# Patient Record
Sex: Male | Born: 1980 | Race: White | Hispanic: No | Marital: Married | Smoking: Current every day smoker
Health system: Southern US, Community
[De-identification: ages and names within clinical notes are randomized; demographics above are authoritative.]

## PROBLEM LIST (undated history)

## (undated) DIAGNOSIS — N289 Disorder of kidney and ureter, unspecified: Secondary | ICD-10-CM

## (undated) DIAGNOSIS — J45909 Unspecified asthma, uncomplicated: Secondary | ICD-10-CM

---

## 2009-06-19 ENCOUNTER — Emergency Department: Payer: Self-pay | Admitting: Emergency Medicine

## 2011-03-23 NOTE — Assessment & Plan Note (Signed)
[facility]                                 ON-CALL NOTE   LEVELLE, EDELEN                         MRN:          [id number]  DATE:06/19/2009                            DOB:          1980-12-15    Hall record #[id number].   Telephone number cell [phone].   I received a phone call from [facility] emergency room on June 19, 2009  at 7:05 a.m.  They asked me to arrange outpatient follow-up for Mr.  Neilson Oehlert. The patient is 30 years of age.  He does smoke.  There is  a strong family history of coronary disease.  The patient presented to  the [facility] emergency room with chest and arm discomfort.  This was  felt to be very atypical.  Cardiac enzymes were negative.  EKG revealed  slight T-wave in lead III.  It was felt overall that the patient's  presentation was not consistent with an acute coronary syndrome.  It was  felt he could go home with outpatient cardiac follow-up.   The patient is discharged from [facility] emergency room this morning.  I  will be contacting our office in [place] this morning to arrange for  the patient to be contacted for outpatient follow-up.     Luis Abed, MD, [employer]  Electronically Signed    JDK/MedQ  DD: 06/19/2009  DT: 06/19/2009  Job #: [id number]

## 2013-09-12 ENCOUNTER — Emergency Department: Payer: Self-pay | Admitting: Emergency Medicine

## 2013-09-13 LAB — CBC
HCT: 46 % (ref 40.0–52.0)
MCHC: 34.9 g/dL (ref 32.0–36.0)
MCV: 92 fL (ref 80–100)
Platelet: 218 10*3/uL (ref 150–440)
RBC: 4.97 10*6/uL (ref 4.40–5.90)

## 2013-09-13 LAB — URINALYSIS, COMPLETE
Bilirubin,UR: NEGATIVE
Hyaline Cast: 2
Leukocyte Esterase: NEGATIVE
Nitrite: NEGATIVE
Protein: 100
RBC,UR: 370 /HPF (ref 0–5)
Specific Gravity: 1.025 (ref 1.003–1.030)
Squamous Epithelial: <1
WBC UR: 3 /HPF (ref 0–5)

## 2013-09-13 LAB — COMPREHENSIVE METABOLIC PANEL
Alkaline Phosphatase: 99 U/L (ref 50–136)
Anion Gap: 8 (ref 7–16)
Calcium, Total: 9.2 mg/dL (ref 8.5–10.1)
Creatinine: 1 mg/dL (ref 0.60–1.30)
EGFR (African American): >60
Glucose: 114 mg/dL — ABNORMAL HIGH (ref 65–99)
Potassium: 3.3 mmol/L — ABNORMAL LOW (ref 3.5–5.1)
SGOT(AST): 26 U/L (ref 15–37)
Total Protein: 7.9 g/dL (ref 6.4–8.2)

## 2014-07-27 IMAGING — CT CT STONE STUDY
3 of 5 series · 5 of 16 positions shown, 6 images · non-contrast
Comparison: No priors.

CLINICAL DATA: Right-sided flank pain.

EXAM:
CT ABDOMEN AND PELVIS WITHOUT
TECHNIQUE: Multidetector CT imaging of the abdomen and pelvis was performed
following the standard protocol without IV contrast.

[Series 4: lung · axial · 0.79mm/px · z∈[-554,-554]mm · 1 of 18 slices shown, 2 images]
[im 1/18  soft-tissue]
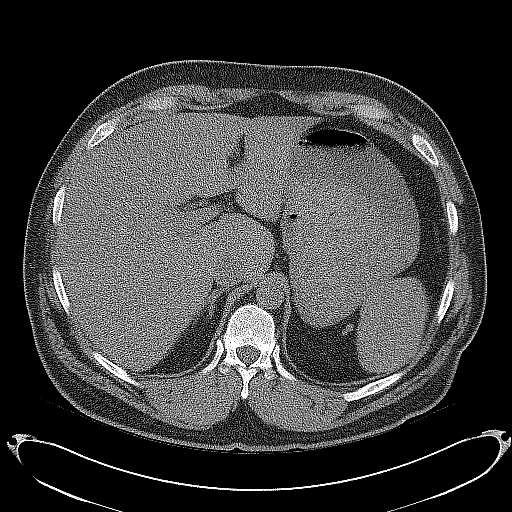
[im 1/18  bone]
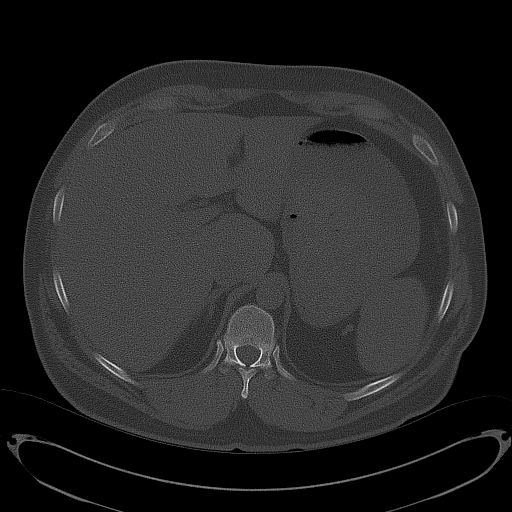

[Series 5: coronal · coronal · 0.80mm/px · 3 of 131 slices shown]
[im 33/131  soft-tissue]
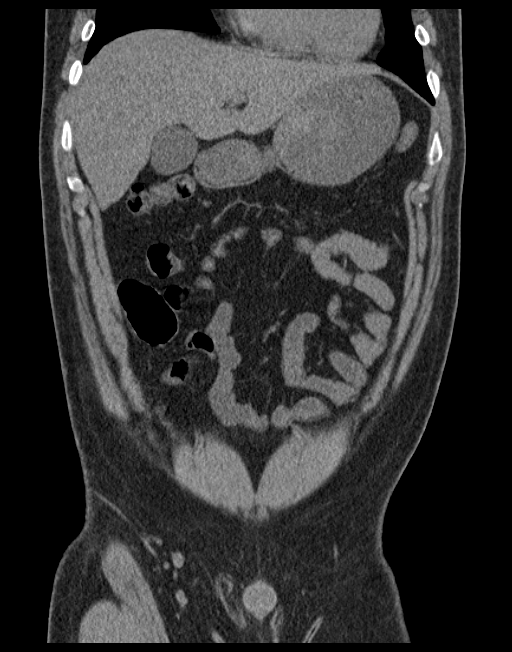
[im 66/131  soft-tissue]
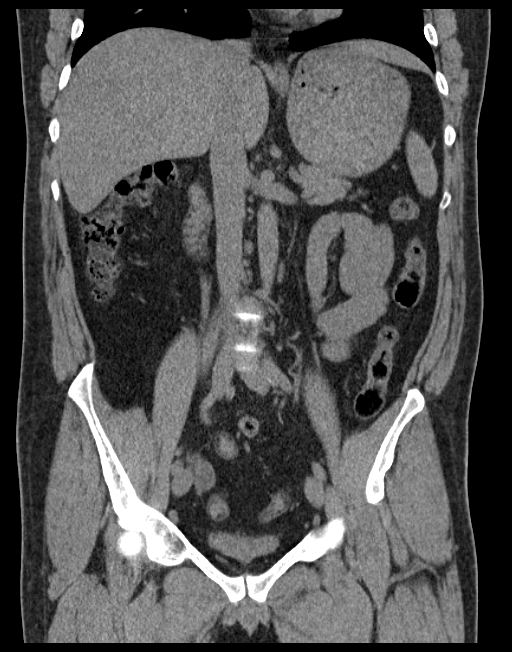
[im 98/131  soft-tissue]
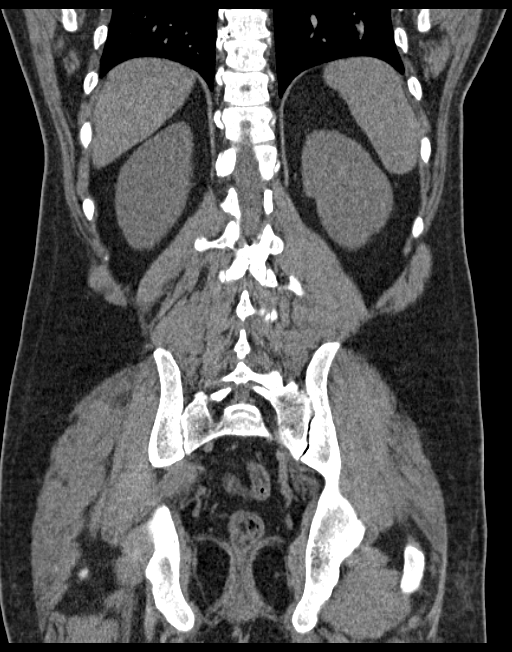

[Series 6: sagittal · sagittal · 0.67mm/px · 1 of 174 slices shown]
[im 70/174  soft-tissue]
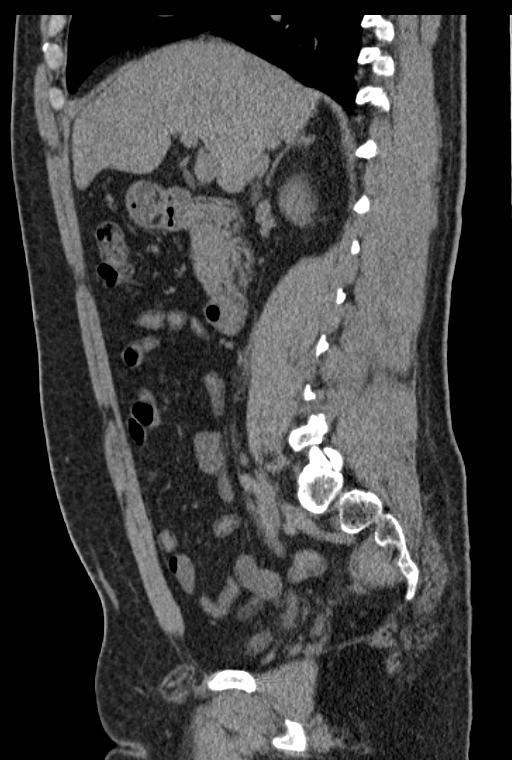

[5 of 16 positions shown; findings below may reference images not displayed]

FINDINGS: Lung Bases: Unremarkable.

Abdomen/Pelvis: Image 85 of series 2 demonstrates a 2 mm calculus at
or immediately beyond the right ureterovesicular junction. This is
associated with mild right-sided hydroureteronephrosis. 4 mm
nonobstructive calculus in the lower pole collecting system of the
left kidney is also noted. The unenhanced appearance of the kidneys
is otherwise unremarkable bilaterally.

The unenhanced appearance of the liver, gallbladder, pancreas,
spleen and bilateral adrenal glands is unremarkable. Normal
appendix. No significant volume of ascites. No pneumoperitoneum. No
pathologic distention of small bowel. No definite lymphadenopathy
identified within the abdomen or pelvis on today's non contrast CT
examination. Prostate gland and urinary bladder are otherwise
unremarkable in appearance.

Musculoskeletal: There are no aggressive appearing lytic or blastic
lesions noted in the visualized portions of the skeleton.
IMPRESSION: 1. 2 mm partially obstructive calculus at or immediately beyond the
right ureterovesicular junction with mild right
hydroureteronephrosis.
2. 4 mm nonobstructive calculus in the lower pole collecting system
of left kidney.
3. Normal appendix.

## 2015-02-28 ENCOUNTER — Emergency Department
Admit: 2015-02-28 | Disposition: A | Discharge status: Home / Self Care | Payer: Self-pay | Admitting: Emergency Medicine

## 2015-03-08 ENCOUNTER — Emergency Department
Admit: 2015-03-08 | Disposition: A | Discharge status: Home / Self Care | Payer: Self-pay | Admitting: Emergency Medicine

## 2015-08-13 ENCOUNTER — Encounter: Payer: Self-pay | Admitting: Emergency Medicine

## 2015-08-13 ENCOUNTER — Emergency Department
Admission: EM | Admit: 2015-08-13 | Discharge: 2015-08-13 | Disposition: A | Discharge status: Home / Self Care | Payer: Medicaid Other | Attending: Emergency Medicine | Admitting: Emergency Medicine

## 2015-08-13 DIAGNOSIS — R51 Headache: Secondary | ICD-10-CM | POA: Insufficient documentation

## 2015-08-13 DIAGNOSIS — R519 Headache, unspecified: Secondary | ICD-10-CM

## 2015-08-13 DIAGNOSIS — M542 Cervicalgia: Secondary | ICD-10-CM | POA: Insufficient documentation

## 2015-08-13 DIAGNOSIS — Z720 Tobacco use: Secondary | ICD-10-CM | POA: Insufficient documentation

## 2015-08-13 HISTORY — DX: Unspecified asthma, uncomplicated: J45.909

## 2015-08-13 HISTORY — DX: Disorder of kidney and ureter, unspecified: N28.9

## 2015-08-13 MED ORDER — DIPHENHYDRAMINE HCL 50 MG/ML IJ SOLN
25.0000 mg | Freq: Once | INTRAMUSCULAR | Status: AC
  Administered 2015-08-13: 25 mg via INTRAVENOUS
  Filled 2015-08-13: qty 1

## 2015-08-13 MED ORDER — PROCHLORPERAZINE EDISYLATE 5 MG/ML IJ SOLN
10.0000 mg | Freq: Once | INTRAMUSCULAR | Status: AC
  Administered 2015-08-13: 10 mg via INTRAVENOUS
  Filled 2015-08-13: qty 2

## 2015-08-13 MED ORDER — BUTALBITAL-APAP-CAFFEINE 50-325-40 MG PO TABS: 1.0000 | ORAL_TABLET | Freq: Four times a day (QID) | ORAL | Status: AC | PRN

## 2015-08-13 MED ORDER — SODIUM CHLORIDE 0.9 % IV BOLUS (SEPSIS)
1000.0000 mL | Freq: Once | INTRAVENOUS | Status: AC
  Administered 2015-08-13: 1000 mL via INTRAVENOUS

## 2015-08-13 NOTE — Discharge Instructions (Signed)

## 2015-08-13 NOTE — ED Provider Notes (Signed)
[facility] Emergency Department Provider Note  ____________________________________________  Time seen: Approximately 11:22 AM  I have reviewed the triage vital signs and the nursing notes.   HISTORY  Chief Complaint Headache    HPI Christopher Holland is a 34 y.o. male who denies any chronic medical problems who is presenting today with right sided neck as well as head pain over the past one half weeks. He says that it comes and goes and worsens when he is holding up his head. However, he said that this last episode started at 2 AM mildly and then increased to it is now which is an 8 out of 9. He says that the pain radiates up the right side of his head and hurts when he even touches his hair. He says that he also has blurred vision to his right eye when he is having this pain.   Past Medical History  Diagnosis Date  . Renal disorder   . Asthma     There are no active problems to display for this patient.   History reviewed. No pertinent past surgical history.  No current outpatient prescriptions on file.  Allergies Review of patient's allergies indicates no known allergies.  History reviewed. No pertinent family history.  Social History Social History  Substance Use Topics  . Smoking status: Current Every Day Smoker  . Smokeless tobacco: None  . Alcohol Use: Yes    Review of Systems Constitutional: No fever/chills Eyes: As above  ENT: No sore throat. Cardiovascular: Denies chest pain. Respiratory: Denies shortness of breath. Gastrointestinal: No abdominal pain.  No nausea, no vomiting.  No diarrhea.  No constipation. Genitourinary: Negative for dysuria. Musculoskeletal: Negative for back pain. Skin: Negative for rash. Neurological: Negative focal weakness or numbness.  10-point ROS otherwise negative.  ____________________________________________   PHYSICAL EXAM:  VITAL SIGNS: ED Triage Vitals  Enc Vitals Group     BP 08/13/15  1033 144/92 mmHg     Pulse Rate 08/13/15 1033 84     Resp 08/13/15 1033 18     Temp 08/13/15 1033 97.5 F (36.4 C)     Temp Source 08/13/15 1033 Oral     SpO2 08/13/15 1033 98 %     Weight 08/13/15 1033 230 lb (104.327 kg)     Height 08/13/15 1033 6' (1.829 m)     Head Cir --      Peak Flow --      Pain Score 08/13/15 1048 10     Pain Loc --      Pain Edu? --      Excl. in GC? --     Constitutional: Alert and oriented. Well appearing and in no acute distress. Eyes: Conjunctivae are normal. PERRL. EOMI. Head: Atraumatic. Nose: No congestion/rhinnorhea. Mouth/Throat: Mucous membranes are moist.  Oropharynx non-erythematous. Neck: No stridor.  Mild tenderness to the right trapezius. Ranges the neck freely without any distress or meningismus.  Cardiovascular: Normal rate, regular rhythm. Grossly normal heart sounds.  Good peripheral circulation. Respiratory: Normal respiratory effort.  No retractions. Lungs CTAB. Gastrointestinal: Soft and nontender. No distention. No abdominal bruits. No CVA tenderness. Musculoskeletal: No lower extremity tenderness nor edema.  No joint effusions. Neurologic:  Normal speech and language. No gross focal neurologic deficits are appreciated. No gait instability. Skin:  Skin is warm, dry and intact. No rash noted. Psychiatric: Mood and affect are normal. Speech and behavior are normal.  ____________________________________________   LABS (all labs ordered are listed, but only abnormal  results are displayed)  Labs Reviewed - No data to display ____________________________________________  EKG   ____________________________________________  RADIOLOGY   ____________________________________________   PROCEDURES    ____________________________________________   INITIAL IMPRESSION / ASSESSMENT AND PLAN / ED COURSE  Pertinent labs & imaging results that were available during my care of the patient were reviewed by me and considered in my  medical decision making (see chart for details).  ----------------------------------------- 1:56 PM on 08/13/2015 -----------------------------------------  Patient resting comfortably at this time with headache now in a 2-3 out of 10. Discussed with the patient the possibility of a lidocaine injection to the occiput and over the patient would not like this at this time. He says his pain is much relieved and is willing to follow-up with a neurologist as an outpatient. Symptoms are largely migrainous. No visual change at this time. We'll discharge patient with Fioricet for symptomatic control. Do not suspect a cranial hemorrhage. Symptoms are waxing and waning and not of a sudden onset thunderclap quality. Also without focal weakness, numbness or tingling. ____________________________________________   FINAL CLINICAL IMPRESSION(S) / ED DIAGNOSES  Acute headache. Initial visit.    Myrna Blazer, MD 08/13/15 [phone]

## 2015-08-13 NOTE — ED Notes (Signed)
Pt to ed with c/o right side of head pain intermittently x 1 1/2 weeks.  Pt states this am started about 2 am.  Pt reports blurred vision in right eye and only during pain episodes. States has tried tylenol and IBU without relief.

## 2015-08-13 NOTE — ED Notes (Signed)
Patient comes in complaining of right sided headache unrelieved by Ibuprogen  PO and Tylenol  PO taken 07:30am.

## 2015-08-14 MED ORDER — FLUORESCEIN SODIUM 1 MG OP STRP
ORAL_STRIP | OPHTHALMIC | Status: AC
  Filled 2015-08-14: qty 1

## 2015-08-14 MED ORDER — TETRACAINE HCL 0.5 % OP SOLN
OPHTHALMIC | Status: AC
  Filled 2015-08-14: qty 2
